# Patient Record
Sex: Male | Born: 1990 | Race: White | Hispanic: No | Marital: Single | Smoking: Never smoker
Health system: Southern US, Community
[De-identification: ages and names within clinical notes are randomized; demographics above are authoritative.]

---
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
## 2010-10-19 ENCOUNTER — Emergency Department (HOSPITAL_COMMUNITY): Payer: Managed Care, Other (non HMO)

## 2010-10-19 ENCOUNTER — Inpatient Hospital Stay (INDEPENDENT_AMBULATORY_CARE_PROVIDER_SITE_OTHER)
Admission: RE | Admit: 2010-10-19 | Discharge: 2010-10-19 | Disposition: A | Discharge status: Home / Self Care | Payer: Managed Care, Other (non HMO) | Source: Ambulatory Visit | Attending: Family Medicine | Admitting: Family Medicine

## 2010-10-19 ENCOUNTER — Emergency Department (HOSPITAL_COMMUNITY)
Admission: EM | Admit: 2010-10-19 | Discharge: 2010-10-19 | Disposition: A | Discharge status: Home / Self Care | Payer: Managed Care, Other (non HMO) | Attending: Emergency Medicine | Admitting: Emergency Medicine

## 2010-10-19 DIAGNOSIS — R12 Heartburn: Secondary | ICD-10-CM | POA: Insufficient documentation

## 2010-10-19 DIAGNOSIS — R000 Tachycardia, unspecified: Secondary | ICD-10-CM | POA: Insufficient documentation

## 2010-10-19 DIAGNOSIS — R109 Unspecified abdominal pain: Secondary | ICD-10-CM

## 2010-10-19 DIAGNOSIS — R1013 Epigastric pain: Secondary | ICD-10-CM | POA: Insufficient documentation

## 2010-10-19 LAB — DIFFERENTIAL
Eosinophils Absolute: 0 10*3/uL (ref 0.0–0.7)
Lymphs Abs: 1.7 10*3/uL (ref 0.7–4.0)
Monocytes Absolute: 0.6 10*3/uL (ref 0.1–1.0)
Monocytes Relative: 6 % (ref 3–12)
Neutrophils Relative %: 77 % (ref 43–77)

## 2010-10-19 LAB — COMPREHENSIVE METABOLIC PANEL
CO2: 24 mEq/L (ref 19–32)
Calcium: 9.4 mg/dL (ref 8.4–10.5)
Creatinine, Ser: 0.89 mg/dL (ref 0.50–1.35)
GFR calc Af Amer: >60 mL/min (ref >60)
GFR calc non Af Amer: >60 mL/min (ref >60)
Glucose, Bld: 96 mg/dL (ref 70–99)
Total Protein: 8.1 g/dL (ref 6.0–8.3)

## 2010-10-19 LAB — CBC
Hemoglobin: 16.5 g/dL (ref 13.0–17.0)
MCH: 31.3 pg (ref 26.0–34.0)
MCHC: 36.3 g/dL — ABNORMAL HIGH (ref 30.0–36.0)
MCV: 86.1 fL (ref 78.0–100.0)
Platelets: 185 10*3/uL (ref 150–400)
RBC: 5.27 MIL/uL (ref 4.22–5.81)

## 2010-10-19 LAB — URINALYSIS, ROUTINE W REFLEX MICROSCOPIC
Leukocytes, UA: NEGATIVE
Nitrite: NEGATIVE
Protein, ur: NEGATIVE mg/dL
Urobilinogen, UA: 0.2 mg/dL (ref 0.0–1.0)

## 2010-10-22 ENCOUNTER — Other Ambulatory Visit: Payer: Self-pay | Admitting: Gastroenterology

## 2010-10-23 ENCOUNTER — Ambulatory Visit
Admission: RE | Admit: 2010-10-23 | Discharge: 2010-10-23 | Disposition: A | Discharge status: Home / Self Care | Payer: Managed Care, Other (non HMO) | Source: Ambulatory Visit | Attending: Gastroenterology | Admitting: Gastroenterology

## 2018-04-08 ENCOUNTER — Ambulatory Visit (HOSPITAL_COMMUNITY)
Admission: EM | Admit: 2018-04-08 | Discharge: 2018-04-08 | Disposition: A | Discharge status: Home / Self Care | Payer: Managed Care, Other (non HMO) | Attending: Family Medicine | Admitting: Family Medicine

## 2018-04-08 ENCOUNTER — Other Ambulatory Visit: Payer: Self-pay

## 2018-04-08 ENCOUNTER — Encounter (HOSPITAL_COMMUNITY): Payer: Self-pay | Admitting: Family Medicine

## 2018-04-08 DIAGNOSIS — R197 Diarrhea, unspecified: Secondary | ICD-10-CM | POA: Diagnosis not present

## 2018-04-08 MED ORDER — LOPERAMIDE HCL 2 MG PO CAPS: 2.0000 mg | ORAL_CAPSULE | Freq: Four times a day (QID) | ORAL | 0 refills | Status: AC | PRN

## 2018-04-08 NOTE — ED Provider Notes (Signed)
[facility]    CSN: [id number] Arrival date & time: 04/08/18  1759     History   Chief Complaint Chief Complaint  Patient presents with  . Bloated  . Diarrhea    HPI Terry Robinson is a 28 y.o. male.   This is the initial [facility] urgent care visit for this 28 year old man complaining of diarrhea, bloating, and abdominal discomfort.  He has had these symptoms for 9 days, since returning from [place].  His wife is similarly affected.  He does not have to go at night, but eating precipitates a trip to the bathroom.  Some mild nausea, but no vomiting.  No blood in stool, cramps, abdominal pain, fever or prior diarrheal illnesses.   Patient is a day trader on the stock market.     History reviewed. No pertinent past medical history.  There are no active problems to display for this patient.   History reviewed. No pertinent surgical history.     Home Medications    Prior to Admission medications   Medication Sig Start Date End Date Taking? Authorizing Provider  hyoscyamine (LEVSIN, ANASPAZ) 0.125 MG tablet  04/04/18  Yes [provider]  loperamide (IMODIUM) 2 MG capsule Take 1 capsule (2 mg total) by mouth 4 (four) times daily as needed for diarrhea or loose stools. 04/08/18   Elvina Sidle, MD    Family History History reviewed. No pertinent family history.  Social History Social History   Tobacco Use  . Smoking status: Never Smoker  . Smokeless tobacco: Never Used  Substance Use Topics  . Alcohol use: Never    Frequency: Never  . Drug use: Never     Allergies   Penicillins   Review of Systems Review of Systems  Constitutional: Negative.   Respiratory: Negative.   Cardiovascular: Negative.   Gastrointestinal: Positive for abdominal distention, diarrhea and nausea. Negative for abdominal pain, anal bleeding, blood in stool, constipation and vomiting.  Genitourinary: Negative.   Neurological: Negative.   All other systems  reviewed and are negative.    Physical Exam Triage Vital Signs ED Triage Vitals  Enc Vitals Group     BP      Pulse      Resp      Temp      Temp src      SpO2      Weight      Height      Head Circumference      Peak Flow      Pain Score      Pain Loc      Pain Edu?      Excl. in GC?    No data found.  Updated Vital Signs BP (!) 138/95 (BP Location: Left Arm)   Pulse (!) 101   Temp 99.8 F (37.7 C) (Oral)   Resp 18   SpO2 96%    Physical Exam Vitals signs and nursing note reviewed.  Constitutional:      General: He is not in acute distress.    Appearance: Normal appearance. He is obese. He is not ill-appearing.  HENT:     Head: Normocephalic.     Nose: Nose normal.     Mouth/Throat:     Mouth: Mucous membranes are moist.  Eyes:     Conjunctiva/sclera: Conjunctivae normal.  Neck:     Musculoskeletal: Normal range of motion and neck supple.  Cardiovascular:     Rate and Rhythm: Regular rhythm. Tachycardia present.  Heart sounds: Normal heart sounds.  Pulmonary:     Effort: Pulmonary effort is normal.     Breath sounds: Normal breath sounds.  Abdominal:     General: Bowel sounds are normal. There is distension.     Palpations: There is no mass.     Tenderness: There is no abdominal tenderness. There is no guarding.  Musculoskeletal: Normal range of motion.  Skin:    General: Skin is warm and dry.  Neurological:     General: No focal deficit present.     Mental Status: He is alert and oriented to person, place, and time.  Psychiatric:        Mood and Affect: Mood normal.        Thought Content: Thought content normal.      UC Treatments / Results  Labs (all labs ordered are listed, but only abnormal results are displayed) Labs Reviewed  GASTROINTESTINAL PANEL BY PCR, STOOL (REPLACES STOOL CULTURE)    EKG None  Radiology No results found.  Procedures Procedures (including critical care time)  Medications Ordered in UC Medications  - No data to display  Initial Impression / Assessment and Plan / UC Course  I have reviewed the triage vital signs and the nursing notes.  Pertinent labs & imaging results that were available during my care of the patient were reviewed by me and considered in my medical decision making (see chart for details).    Final Clinical Impressions(s) / UC Diagnoses   Final diagnoses:  Diarrhea of presumed infectious origin   Discharge Instructions   None    ED Prescriptions    Medication Sig Dispense Auth. Provider   loperamide (IMODIUM) 2 MG capsule Take 1 capsule (2 mg total) by mouth 4 (four) times daily as needed for diarrhea or loose stools. 12 capsule Elvina Sidle, MD     Controlled Substance Prescriptions [place] Controlled Substance Registry consulted? Not Applicable   Elvina Sidle, MD 04/08/18 [phone]

## 2018-04-08 NOTE — ED Triage Notes (Signed)
Pt presents to [facility] for abdominal bloating and diarrhea x1 week, pt also states that a few others at home have been experiencing diarrhea also.

## 2018-04-08 NOTE — ED Notes (Signed)
Patient ambulatory to bathroom with steady gait at this time to attempt to provide stool sample.

## 2018-04-08 NOTE — ED Notes (Signed)
Patient verbalizes understanding of discharge instructions. Opportunity for questioning and answers were provided. Patient discharged from [facility] by RN.  

## 2018-04-09 ENCOUNTER — Telehealth (HOSPITAL_COMMUNITY): Payer: Self-pay | Admitting: Emergency Medicine

## 2018-04-09 LAB — GASTROINTESTINAL PANEL BY PCR, STOOL (REPLACES STOOL CULTURE)

## 2018-04-09 NOTE — Telephone Encounter (Signed)
negative Attempted to reach patient. No answer at this time.

## 2018-07-07 ENCOUNTER — Other Ambulatory Visit: Payer: Self-pay | Admitting: Family Medicine

## 2018-07-07 DIAGNOSIS — R1011 Right upper quadrant pain: Secondary | ICD-10-CM

## 2018-07-13 ENCOUNTER — Ambulatory Visit
Admission: RE | Admit: 2018-07-13 | Discharge: 2018-07-13 | Disposition: A | Discharge status: Home / Self Care | Payer: Managed Care, Other (non HMO) | Source: Ambulatory Visit | Attending: Family Medicine | Admitting: Family Medicine

## 2018-07-13 DIAGNOSIS — R1011 Right upper quadrant pain: Secondary | ICD-10-CM

## 2020-03-15 ENCOUNTER — Other Ambulatory Visit: Payer: Self-pay | Admitting: Family Medicine

## 2020-03-15 ENCOUNTER — Other Ambulatory Visit (HOSPITAL_COMMUNITY): Payer: Self-pay | Admitting: Family Medicine

## 2020-03-15 DIAGNOSIS — R1011 Right upper quadrant pain: Secondary | ICD-10-CM

## 2020-04-17 ENCOUNTER — Other Ambulatory Visit: Payer: Self-pay

## 2020-04-17 ENCOUNTER — Ambulatory Visit (HOSPITAL_COMMUNITY)
Admission: RE | Admit: 2020-04-17 | Discharge: 2020-04-17 | Disposition: A | Discharge status: Home / Self Care | Payer: Managed Care, Other (non HMO) | Source: Ambulatory Visit | Attending: Family Medicine | Admitting: Family Medicine

## 2020-04-17 DIAGNOSIS — R1011 Right upper quadrant pain: Secondary | ICD-10-CM | POA: Insufficient documentation

## 2020-04-17 MED ORDER — TECHNETIUM TC 99M MEBROFENIN IV KIT
5.3000 | PACK | Freq: Once | INTRAVENOUS | Status: AC | PRN
  Administered 2020-04-17: 5.3 via INTRAVENOUS

## 2020-05-26 IMAGING — US ULTRASOUND ABDOMEN LIMITED
1 series · 14 of 25 positions shown · non-contrast
Comparison: None.

CLINICAL DATA: Right upper quadrant pain

EXAM:
ULTRASOUND ABDOMEN LIMITED RIGHT UPPER QUADRANT

[Series 1: ultrasound abdomen limited · 0.22mm/px · 14 of 41 slices shown]
[im 1/41]
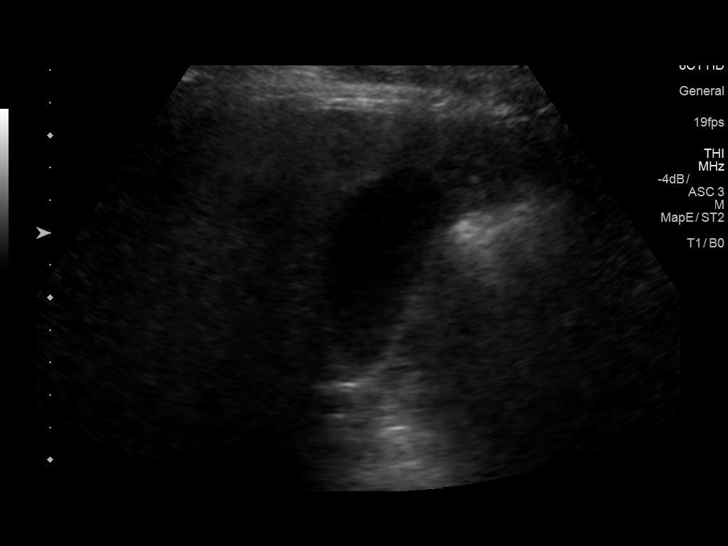
[im 4/41]
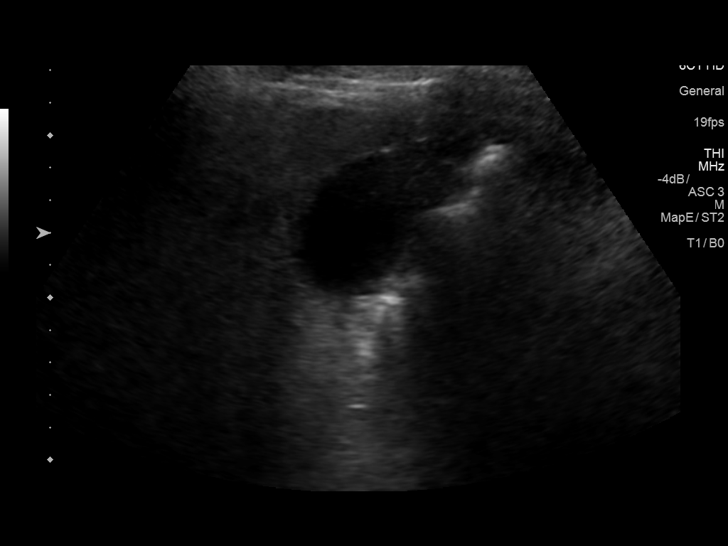
[im 7/41]
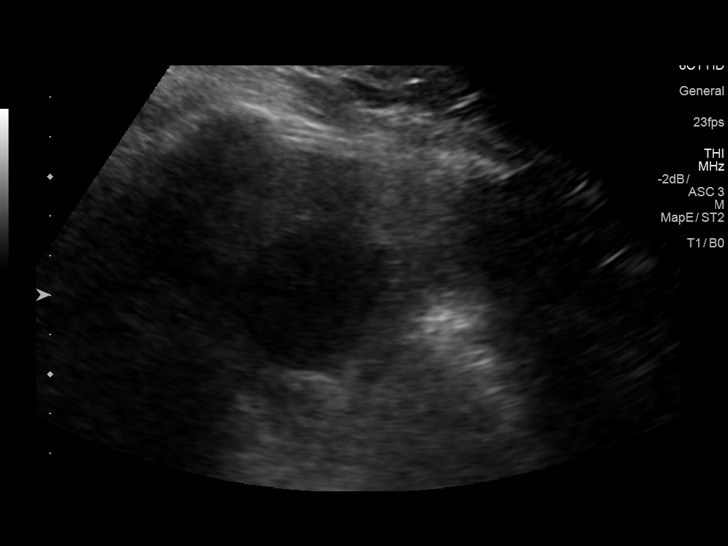
[im 11/41]
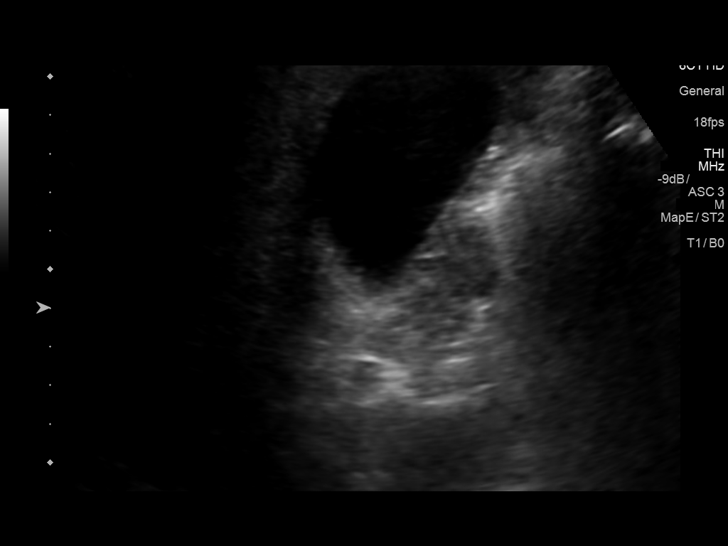
[im 14/41]
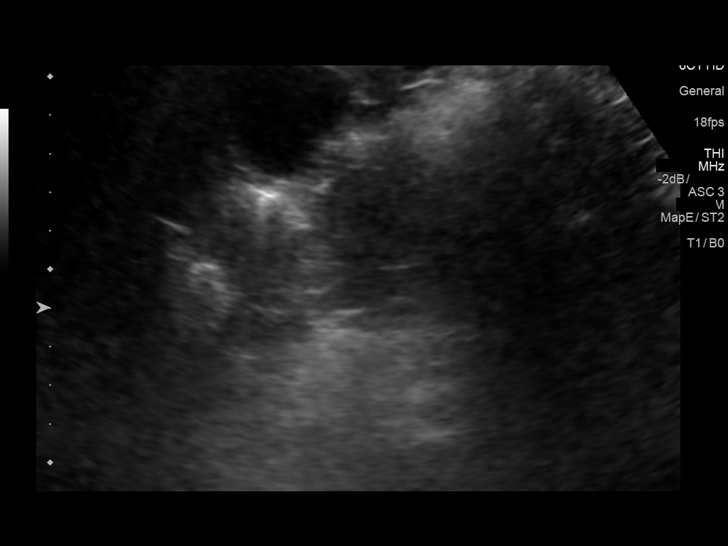
[im 16/41]
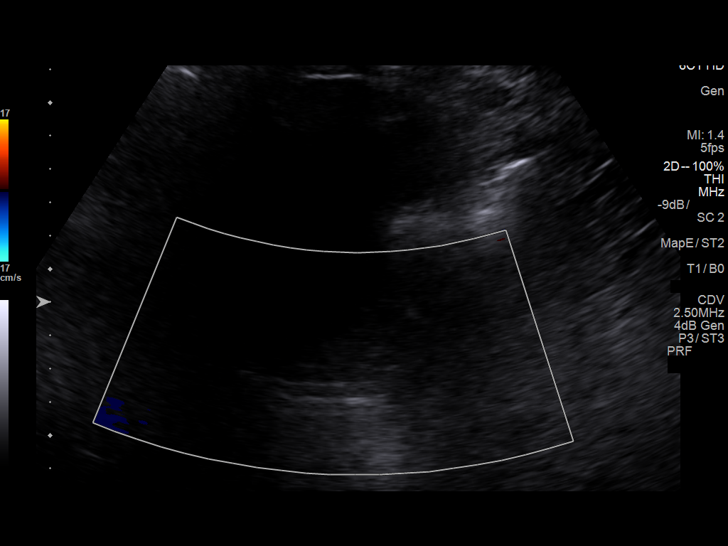
[im 19/41]
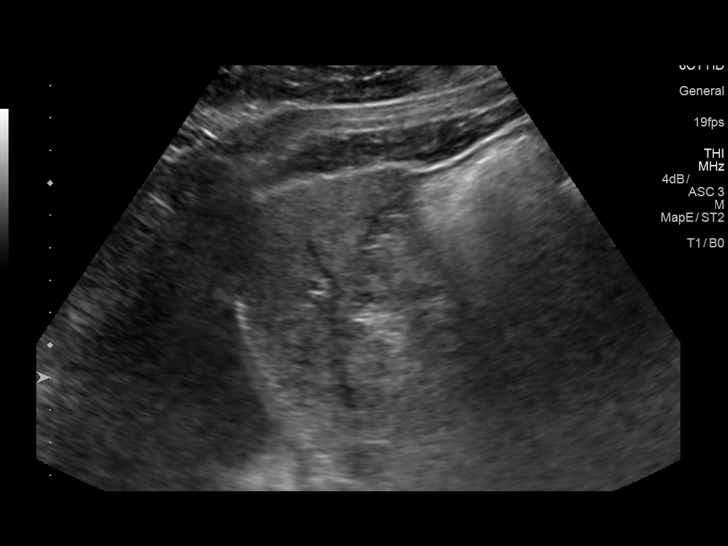
[im 22/41]
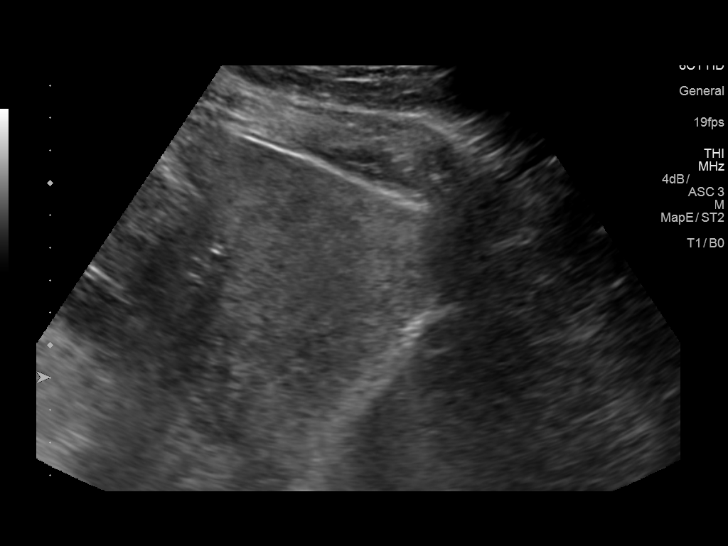
[im 26/41]
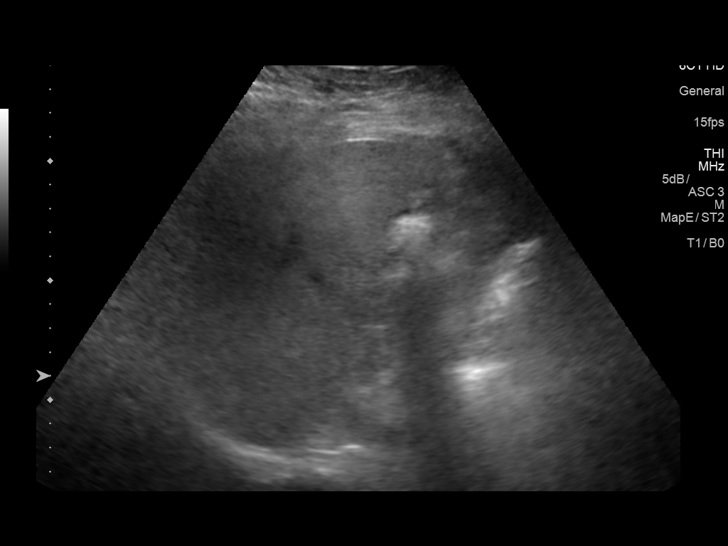
[im 27/41]
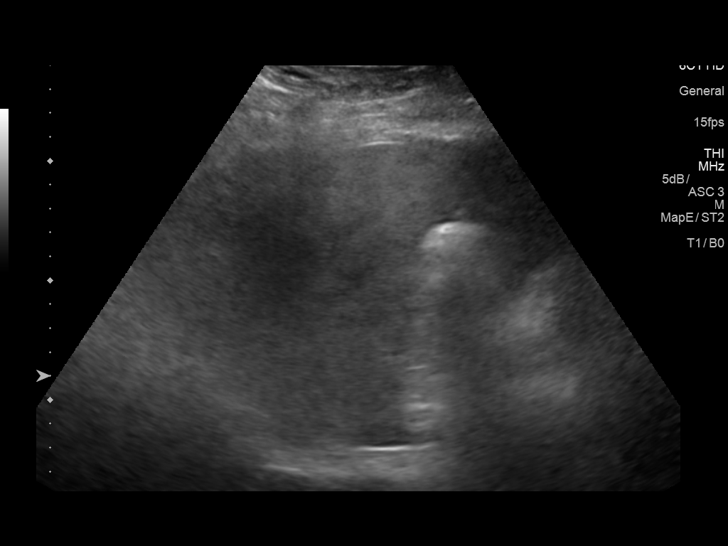
[im 31/41]
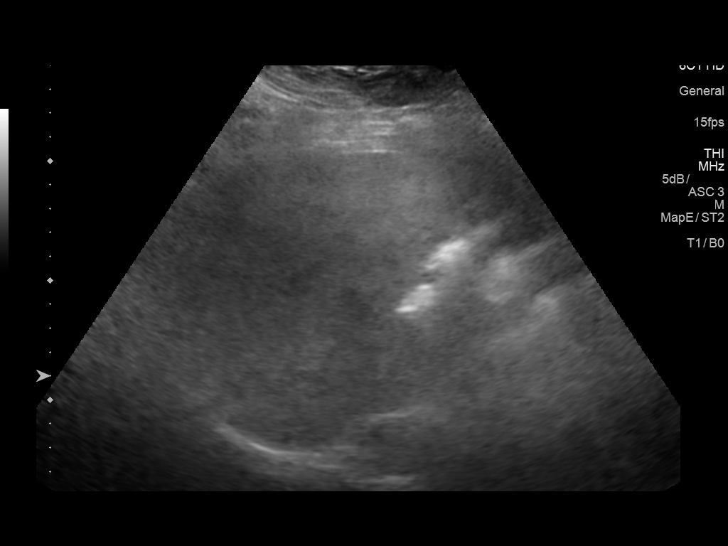
[im 34/41]
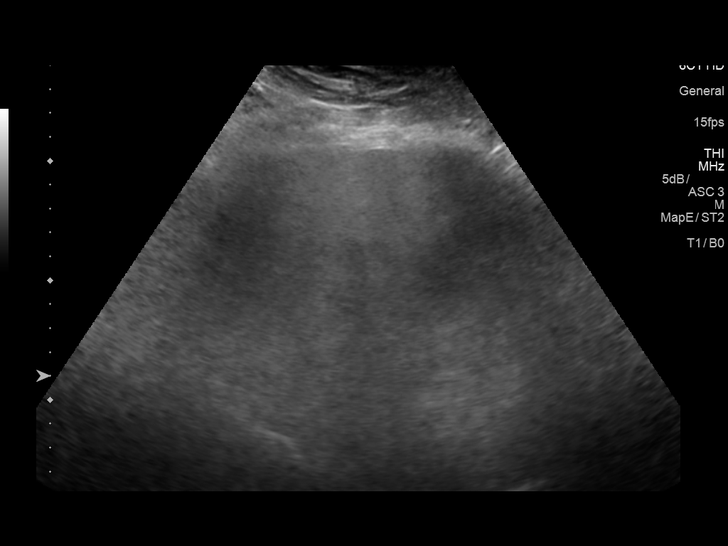
[im 37/41]
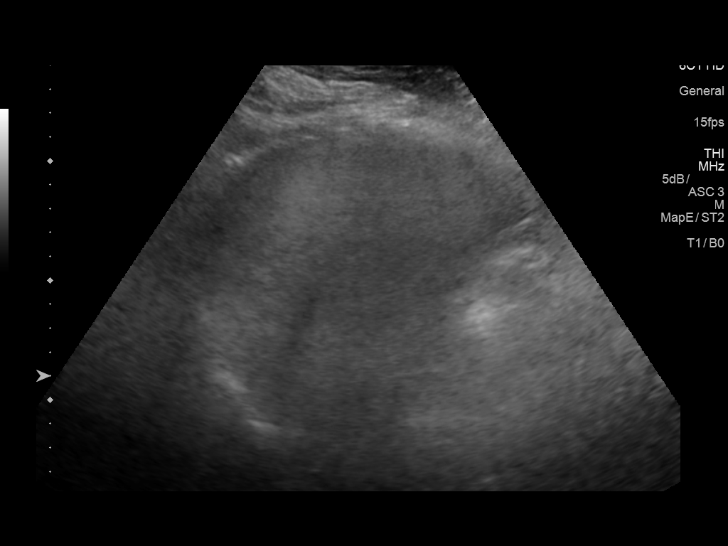
[im 41/41]
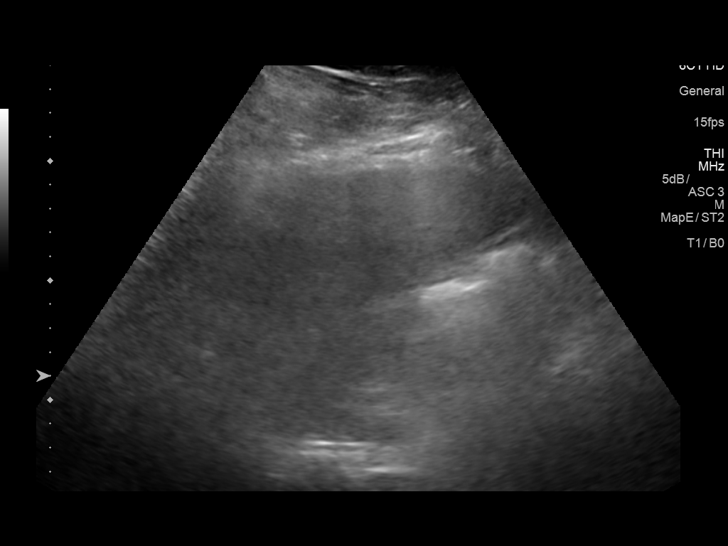

[14 of 25 positions shown; findings below may reference images not displayed]

FINDINGS: Gallbladder:

No gallstones or wall thickening visualized. There is no
pericholecystic fluid. No sonographic Murphy sign noted by
sonographer.

Common bile duct:

Diameter: 4 mm. No intrahepatic or extrahepatic biliary duct
dilatation.

Liver:

No focal lesion identified. Liver echogenicity is increased
diffusely. Portal vein is patent on color Doppler imaging with
normal direction of blood flow towards the liver.
IMPRESSION: Diffuse increase in liver echogenicity, a finding indicative of
hepatic steatosis. While no focal liver lesions are evident on this
study, it must be cautioned that the sensitivity of ultrasound for
detection of focal liver lesions is diminished in this circumstance.

Study otherwise unremarkable.

## 2022-10-22 ENCOUNTER — Ambulatory Visit: Payer: Managed Care, Other (non HMO) | Admitting: Family Medicine
